# Patient Record
Sex: Female | Born: 1955 | Hispanic: Yes | Marital: Married | Smoking: Never smoker
Health system: Southern US, Community
[De-identification: ages and names within clinical notes are randomized; demographics above are authoritative.]

---

## 2011-12-18 ENCOUNTER — Ambulatory Visit: Payer: Self-pay

## 2011-12-25 ENCOUNTER — Ambulatory Visit: Payer: Self-pay

## 2012-01-02 ENCOUNTER — Ambulatory Visit: Payer: Self-pay | Admitting: Internal Medicine

## 2012-01-22 ENCOUNTER — Encounter: Payer: Self-pay | Admitting: Internal Medicine

## 2012-02-07 ENCOUNTER — Encounter: Payer: Self-pay | Admitting: Internal Medicine

## 2012-03-09 ENCOUNTER — Encounter: Payer: Self-pay | Admitting: Internal Medicine

## 2013-04-30 IMAGING — CR DG LUMBAR SPINE COMPLETE 4+V
1 series · 5 of 5 positions shown · non-contrast
Comparison: none

REASON FOR EXAM: pain
COMMENTS:

[Series 1: ap · 0.17mm/px · 5 of 5 slices shown]
[im 1/5]
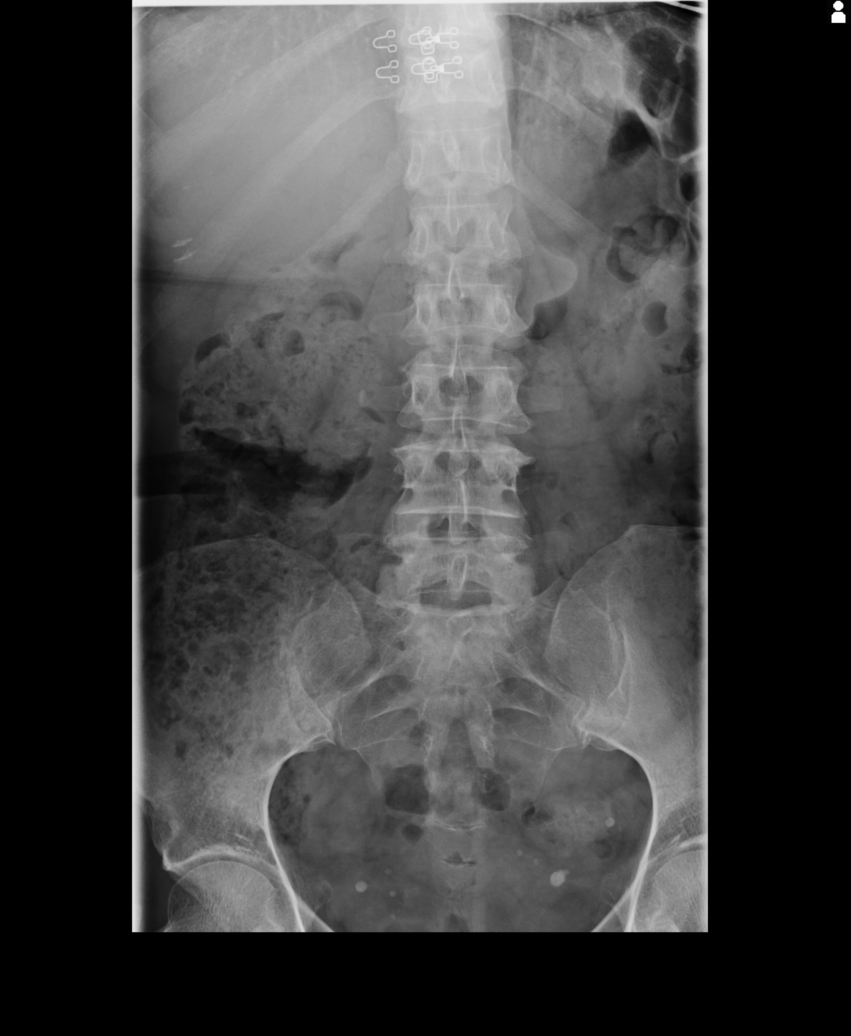
[im 2/5]
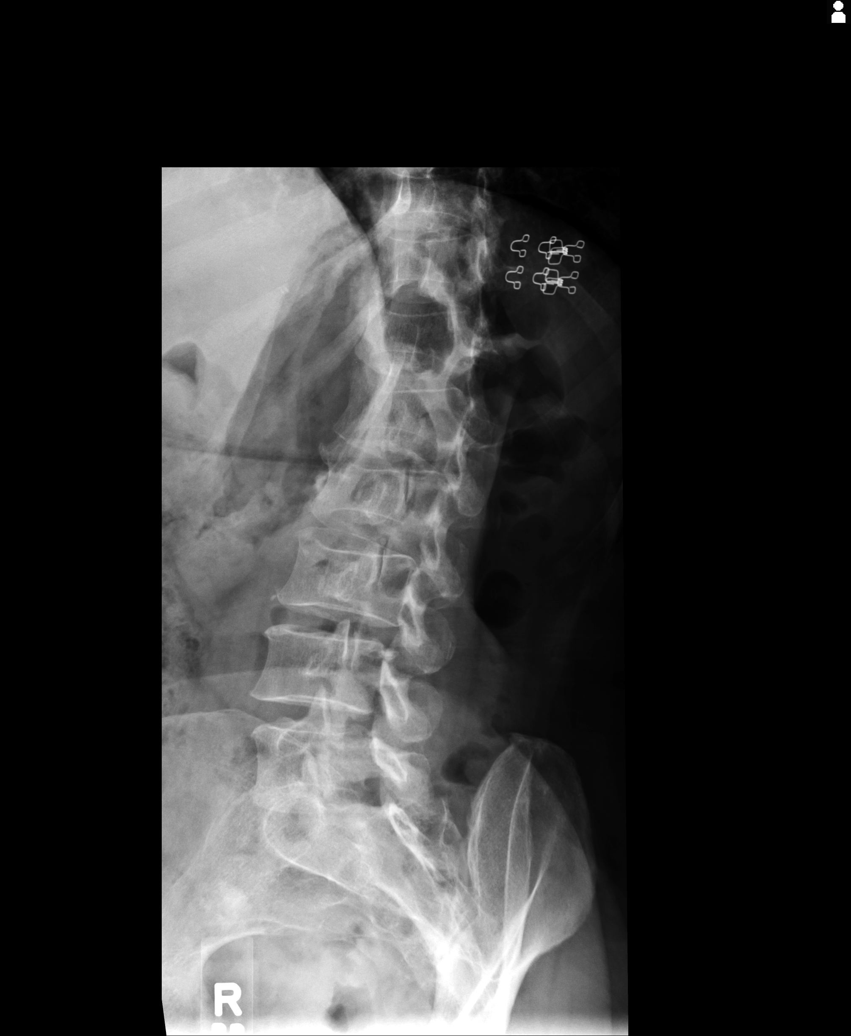
[im 3/5]
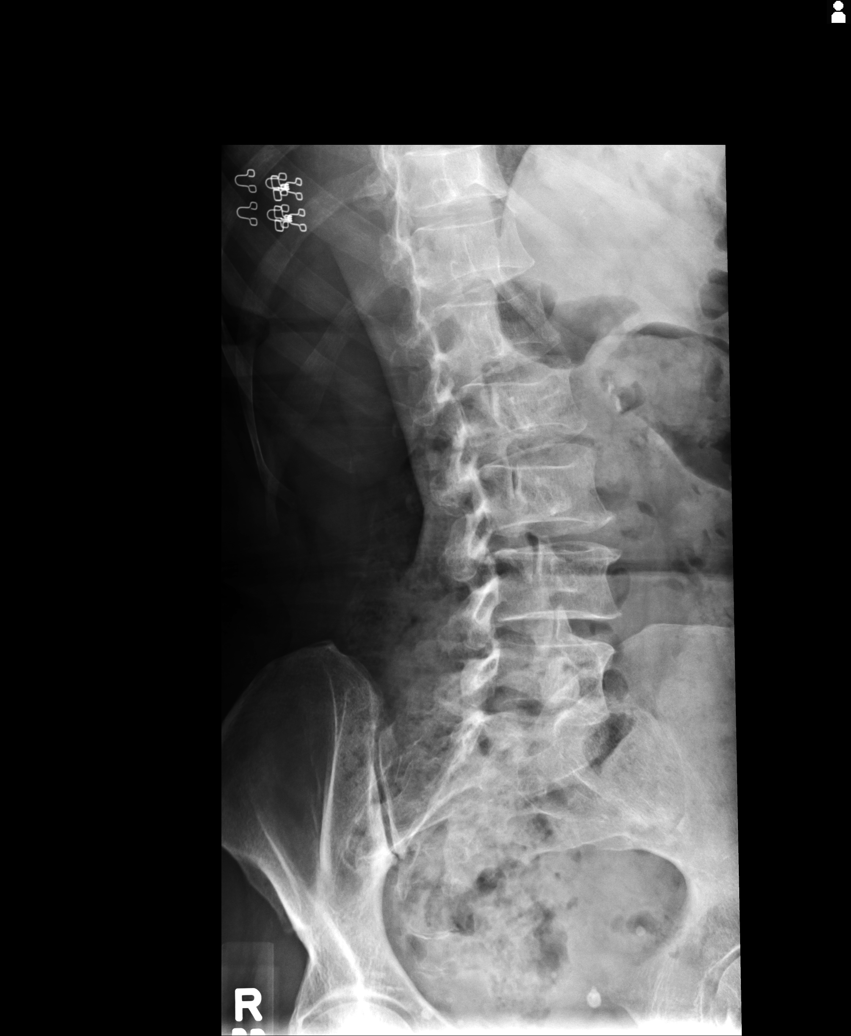
[im 4/5]
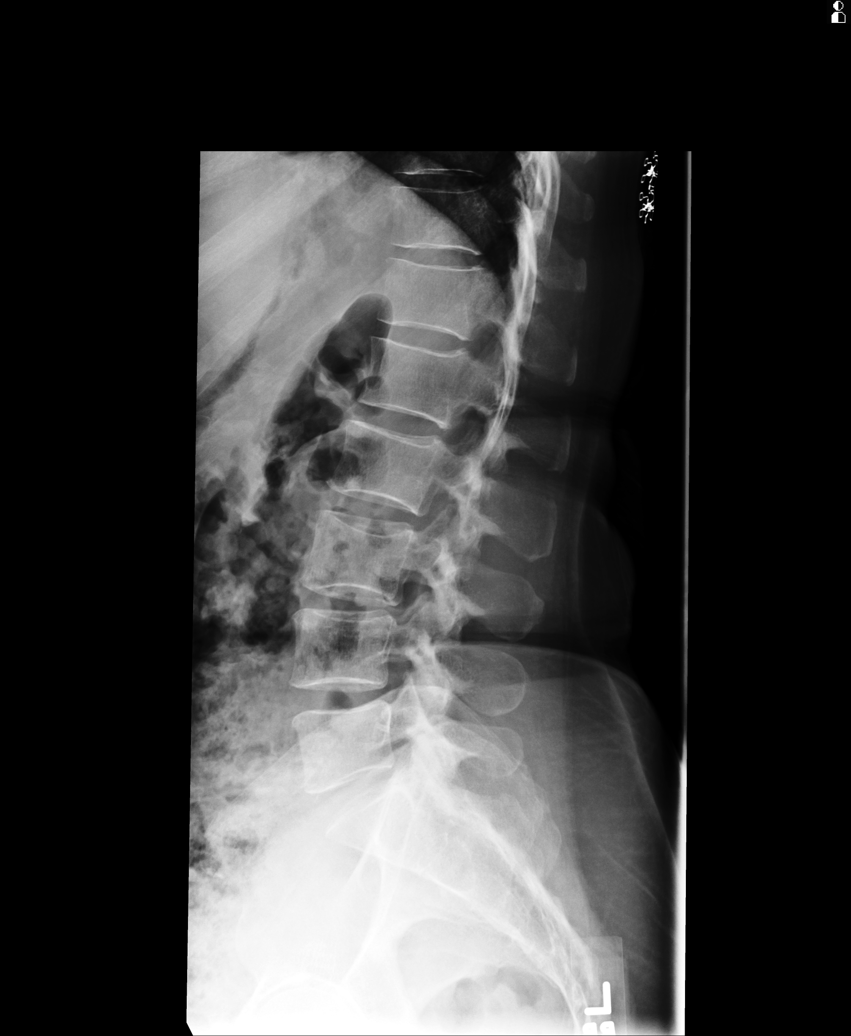
[im 5/5]
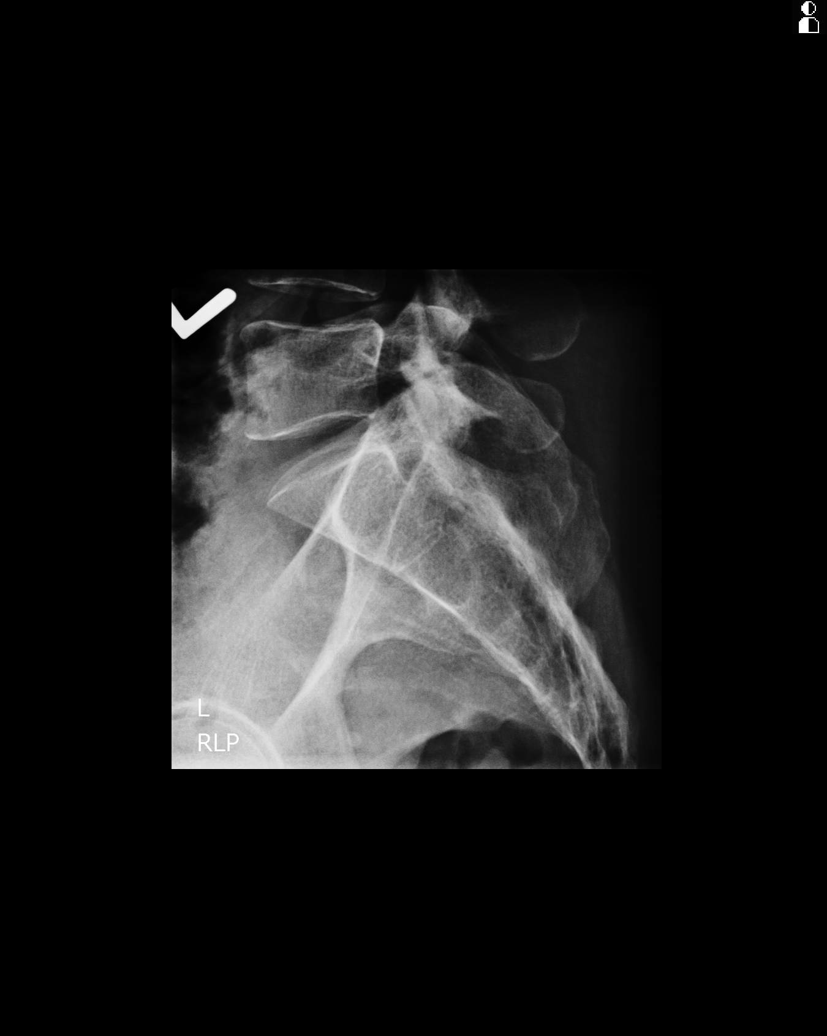

[5 of 5 positions shown; findings below may reference images not displayed]

PROCEDURE:     MDR - M[REDACTED] SPINE WITH OBLIQUES  - December 18, 2011  [DATE]

RESULT:     The lumbar vertebral bodies are preserved in height. The
intervertebral disc space heights are well-maintained. There is no pars
defect or spondylolisthesis. As best as can be determined the pedicles and
transverse processes are intact. There are small endplate osteophytes
present. The observed portions of the sacrum are normal. There is
considerable stool present throughout the bowel consistent with
constipation. There are phleboliths within the pelvis.
IMPRESSION: 1. There is no evidence of acute lumbar spine abnormality nor significant
degenerative change.
2. The bowel gas pattern suggests constipation.

[REDACTED]

## 2021-01-15 ENCOUNTER — Other Ambulatory Visit: Payer: Self-pay

## 2021-01-15 ENCOUNTER — Encounter: Payer: Self-pay | Admitting: Emergency Medicine

## 2021-01-15 ENCOUNTER — Ambulatory Visit
Admission: EM | Admit: 2021-01-15 | Discharge: 2021-01-15 | Disposition: A | Discharge status: Home / Self Care | Payer: BLUE CROSS/BLUE SHIELD | Attending: Physician Assistant | Admitting: Physician Assistant

## 2021-01-15 DIAGNOSIS — M25512 Pain in left shoulder: Secondary | ICD-10-CM | POA: Diagnosis not present

## 2021-01-15 DIAGNOSIS — M25511 Pain in right shoulder: Secondary | ICD-10-CM | POA: Diagnosis not present

## 2021-01-15 DIAGNOSIS — M545 Low back pain, unspecified: Secondary | ICD-10-CM | POA: Diagnosis not present

## 2021-01-15 MED ORDER — MELOXICAM 7.5 MG PO TABS: 7.5000 mg | ORAL_TABLET | Freq: Every day | ORAL | 0 refills | Status: AC

## 2021-01-15 MED ORDER — BACLOFEN 10 MG PO TABS: 10.0000 mg | ORAL_TABLET | Freq: Three times a day (TID) | ORAL | 0 refills | Status: AC | PRN
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
## 2021-01-15 NOTE — Discharge Instructions (Addendum)
BACK PAIN: Stressed avoiding painful activities . RICE (REST, ICE, COMPRESSION, ELEVATION) guidelines reviewed. May alternate ice and heat. Consider use of muscle rubs, Salonpas patches, etc. Use medications as directed including muscle relaxers if prescribed. Take anti-inflammatory medications as prescribed or OTC NSAIDs/Tylenol.  F/u with PCP in 7-10 days for reexamination, and please feel free to call or return to the urgent care at any time for any questions or concerns you may have and we will be happy to help you!   BACK PAIN RED FLAGS: If the back pain acutely worsens or there are any red flag symptoms such as numbness/tingling, leg weakness, saddle anesthesia, or loss of bowel/bladder control, go immediately to the ER. Follow up with us as scheduled or sooner if the pain does not begin to resolve or if it worsens before the follow up    NECK PAIN: Stressed avoiding painful activities. This can exacerbate your symptoms and make them worse.  May apply heat to the areas of pain for some relief. Use medications as directed. Be aware of which medications make you drowsy and do not drive or operate any kind of heavy machinery while using the medication (ie pain medications or muscle relaxers). F/U with PCP for reexamination or return sooner if condition worsens or does not begin to improve over the next few days.   NECK PAIN RED FLAGS: If symptoms get worse than they are right now, you should come back sooner for re-evaluation. If you have increased numbness/ tingling or notice that the numbness/tingling is affecting the legs or saddle region, go to ER. If you ever lose continence go to ER.     
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
